# Patient Record
Sex: Male | Born: 1965 | Race: Black or African American | Hispanic: No | Marital: Married | Smoking: Never smoker
Health system: Southern US, Community
[De-identification: ages and names within clinical notes are randomized; demographics above are authoritative.]

## PROBLEM LIST (undated history)

## (undated) DIAGNOSIS — R55 Syncope and collapse: Secondary | ICD-10-CM

## (undated) DIAGNOSIS — T7840XA Allergy, unspecified, initial encounter: Secondary | ICD-10-CM

## (undated) HISTORY — DX: Syncope and collapse: R55

## (undated) HISTORY — DX: Allergy, unspecified, initial encounter: T78.40XA

---

## 1997-12-21 ENCOUNTER — Ambulatory Visit (HOSPITAL_COMMUNITY): Admission: RE | Admit: 1997-12-21 | Discharge: 1997-12-21 | Payer: Self-pay | Admitting: Urology

## 1998-04-04 ENCOUNTER — Ambulatory Visit (HOSPITAL_COMMUNITY): Admission: RE | Admit: 1998-04-04 | Discharge: 1998-04-04 | Payer: Self-pay | Admitting: Urology

## 2008-09-21 ENCOUNTER — Ambulatory Visit: Payer: Self-pay | Admitting: Family Medicine

## 2008-09-21 DIAGNOSIS — J309 Allergic rhinitis, unspecified: Secondary | ICD-10-CM | POA: Insufficient documentation

## 2008-09-21 DIAGNOSIS — H6120 Impacted cerumen, unspecified ear: Secondary | ICD-10-CM

## 2008-09-30 ENCOUNTER — Encounter: Payer: Self-pay | Admitting: Family Medicine

## 2008-11-01 ENCOUNTER — Ambulatory Visit: Payer: Self-pay | Admitting: Family Medicine

## 2008-11-01 LAB — CONVERTED CEMR LAB
Bilirubin Urine: NEGATIVE
Glucose, Urine, Semiquant: NEGATIVE
Specific Gravity, Urine: 1.025
WBC Urine, dipstick: NEGATIVE
pH: 5.5

## 2008-11-08 ENCOUNTER — Ambulatory Visit: Payer: Self-pay | Admitting: Family Medicine

## 2008-11-08 DIAGNOSIS — R740 Nonspecific elevation of levels of transaminase and lactic acid dehydrogenase [LDH]: Secondary | ICD-10-CM

## 2008-11-10 LAB — CONVERTED CEMR LAB
AST: 54 units/L — ABNORMAL HIGH (ref 0–37)
Albumin: 3.9 g/dL (ref 3.5–5.2)
Basophils Absolute: 0 10*3/uL (ref 0.0–0.1)
CO2: 29 meq/L (ref 19–32)
Chloride: 104 meq/L (ref 96–112)
Ferritin: 639.5 ng/mL — ABNORMAL HIGH (ref 22.0–322.0)
GFR calc non Af Amer: 119.36 mL/min (ref >60)
Glucose, Bld: 89 mg/dL (ref 70–99)
HCT: 48.6 % (ref 39.0–52.0)
Hemoglobin: 16.9 g/dL (ref 13.0–17.0)
Hep B S Ab: NEGATIVE
Lymphs Abs: 2.1 10*3/uL (ref 0.7–4.0)
MCHC: 34.7 g/dL (ref 30.0–36.0)
MCV: 93.6 fL (ref 78.0–100.0)
Monocytes Relative: 10 % (ref 3.0–12.0)
Neutro Abs: 2.1 10*3/uL (ref 1.4–7.7)
PSA: 0.42 ng/mL (ref 0.10–4.00)
Potassium: 4 meq/L (ref 3.5–5.1)
RDW: 12.2 % (ref 11.5–14.6)
Sodium: 135 meq/L (ref 135–145)
TSH: 0.91 microintl units/mL (ref 0.35–5.50)

## 2008-12-21 ENCOUNTER — Ambulatory Visit: Payer: Self-pay | Admitting: Family Medicine

## 2008-12-21 DIAGNOSIS — S6390XA Sprain of unspecified part of unspecified wrist and hand, initial encounter: Secondary | ICD-10-CM | POA: Insufficient documentation

## 2009-12-28 ENCOUNTER — Ambulatory Visit: Payer: Self-pay | Admitting: Family Medicine

## 2009-12-28 DIAGNOSIS — R319 Hematuria, unspecified: Secondary | ICD-10-CM

## 2009-12-28 LAB — CONVERTED CEMR LAB
Glucose, Urine, Semiquant: NEGATIVE
Nitrite: NEGATIVE
Protein, U semiquant: 30
Urobilinogen, UA: 1

## 2009-12-29 ENCOUNTER — Encounter: Payer: Self-pay | Admitting: Family Medicine

## 2010-01-18 ENCOUNTER — Ambulatory Visit: Payer: Self-pay | Admitting: Family Medicine

## 2010-01-20 LAB — CONVERTED CEMR LAB
Glucose, Urine, Semiquant: NEGATIVE
Specific Gravity, Urine: 1.02
WBC Urine, dipstick: NEGATIVE
pH: 7

## 2010-01-25 ENCOUNTER — Ambulatory Visit: Payer: Self-pay | Admitting: Family Medicine

## 2010-01-25 DIAGNOSIS — N390 Urinary tract infection, site not specified: Secondary | ICD-10-CM

## 2010-06-20 NOTE — Assessment & Plan Note (Signed)
Summary: 1 month rov/njr   Vital Signs:  Patient profile:   45 year old male Weight:      256 pounds O2 Sat:      95 % Temp:     98.2 degrees F Pulse rate:   69 / minute BP sitting:   130 / 80  (left arm)  Vitals Entered By: Pura Spice, RN (January 25, 2010 4:06 PM) CC: 1 mth follow up states had urine culture last week and stated "urine clear now"   History of Present Illness: Here to follow up on a UTI. he was seen here on 12-28-09 for urinary urgency and blood in the urine. His culture grew pan-sensitive E. coli, and he took a 10 day course of Levaquin. He now feels fine with no symptoms, and his repeat UA on 01-18-10 was clear.   Allergies: 1)  ! Erythromycin  Past History:  Past Medical History: Reviewed history from 09/21/2008 and no changes required. Chickenpox Allergic rhinitis Syncope  Past Surgical History: Reviewed history from 09/21/2008 and no changes required. Denies surgical history  Review of Systems  The patient denies anorexia, fever, weight loss, weight gain, vision loss, decreased hearing, hoarseness, chest pain, syncope, dyspnea on exertion, peripheral edema, prolonged cough, headaches, hemoptysis, abdominal pain, melena, hematochezia, severe indigestion/heartburn, hematuria, incontinence, genital sores, muscle weakness, suspicious skin lesions, transient blindness, difficulty walking, depression, unusual weight change, abnormal bleeding, enlarged lymph nodes, angioedema, breast masses, and testicular masses.    Physical Exam  General:  Well-developed,well-nourished,in no acute distress; alert,appropriate and cooperative throughout examination Abdomen:  Bowel sounds positive,abdomen soft and non-tender without masses, organomegaly or hernias noted.   Impression & Recommendations:  Problem # 1:  UTI (ICD-599.0)  The following medications were removed from the medication list:    Levaquin 500 Mg Tabs (Levofloxacin) .Marland Kitchen... 1 tab by mouth q  day  Patient Instructions: 1)  He seems to be fully recovered.  2)  Please schedule a follow-up appointment as needed .

## 2010-06-20 NOTE — Assessment & Plan Note (Signed)
Summary: hematuria/dm   Vital Signs:  Patient profile:   45 year old male Height:      78 inches (198.12 cm) Weight:      254 pounds (115.45 kg) BMI:     29.46 O2 Sat:      97 % on Room air Temp:     98.3 degrees F (36.83 degrees C) oral Pulse rate:   70 / minute BP sitting:   120 / 88  (left arm) Cuff size:   large  Vitals Entered By: Josph Macho RMA (December 28, 2009 3:40 PM)  O2 Flow:  Room air CC: Passing of blood in urine-feels a little bit of pressure while urinating X1 day/ CF Is Patient Diabetic? No   History of Present Illness: Patient in today for evaluation of hematuria. he has been noting some mild urinary hesitancy for a while but once his flow has started it is normal strength and not painful. Then in past 24 hours he has noted some suprapubic pressure and had an episode of frank hematuria this am. He has not seen blood again but still has the abdominal discomfort. He had an episode of chills earlier today but no fevers. He denies back or flank pain. No history of n/v/diarrhea/anorexia/new sexual partners/penile lesions or discharge. he does not normally struggle with constipation but he does note he had to strain this am to move his bowels but after he finally did he noted his abdominal discomfort did improve somewhat.  Current Medications (verified): 1)  None  Allergies (verified): 1)  ! Erythromycin  Past History:  Past medical history reviewed for relevance to current acute and chronic problems. Social history (including risk factors) reviewed for relevance to current acute and chronic problems.  Past Medical History: Reviewed history from 09/21/2008 and no changes required. Chickenpox Allergic rhinitis Syncope  Social History: Reviewed history from 09/21/2008 and no changes required. Married Never Smoked Alcohol use-no Drug use-no Regular exercise-no  Review of Systems      See HPI  Physical Exam  General:  Well-developed,well-nourished,in  no acute distress; alert,appropriate and cooperative throughout examination Mouth:  Oral mucosa and oropharynx without lesions or exudates.  Teeth in good repair. Neck:  No deformities, masses, or tenderness noted. Lungs:  Normal respiratory effort, chest expands symmetrically. Lungs are clear to auscultation, no crackles or wheezes. Heart:  Normal rate and regular rhythm. S1 and S2 normal without gallop, murmur, click, rub or other extra sounds. Abdomen:  Bowel sounds positive,abdomen soft and non-tender without masses, organomegaly or hernias noted. Msk:  No deformity or scoliosis noted of thoracic or lumbar spine.   Extremities:  No clubbing, cyanosis, edema, or deformity noted  Psych:  Cognition and judgment appear intact. Alert and cooperative with normal attention span and concentration. No apparent delusions, illusions, hallucinations   Impression & Recommendations:  Problem # 1:  HEMATURIA UNSPECIFIED (ICD-599.70)  His updated medication list for this problem includes:    Levaquin 500 Mg Tabs (Levofloxacin) .Marland Kitchen... 1 tab by mouth q day  Orders: T-Urine Culture ([facility]rum Order) [id number]) First episode, start Levaquin return in 1 mn for repeat UA and further evaluation. maintain adequate hydration and report any concerning symptoms  Complete Medication List: 1)  Levaquin 500 Mg Tabs (Levofloxacin) .Marland Kitchen.. 1 tab by mouth q day  Patient Instructions: 1)  Please schedule a follow-up appointment in 1 month with Dr Abran Cantor. 2)  Urine- dip prior to visit ICD-9 : 599.71 3)  Drink plenty of fluids up to 3-4 quarts  a day. Cranberry juice is especially recommended in addition to large amounts of water. Avoid caffeine & carbonated drinks, they tend to irritate the bladder, Return in 3-5 days if you're not better: sooner if you're feeling worse.  4)  Take your antibiotic as prescribed until ALL of it is gone, but stop if you develop a rash or swelling and contact our office as soon as possible.    Prescriptions: LEVAQUIN 500 MG TABS (LEVOFLOXACIN) 1 tab by mouth q day  #10 x 0   Entered and Authorized by:   Danise Edge MD   Signed by:   Danise Edge MD on 12/28/2009   Method used:   Electronically to        [facility] [id number] (retail)       [address]       [place], [place]  [address]       Ph: [phone] or [phone]       Fax: [phone]   RxID:   [id number]   Laboratory Results   Urine Tests    Routine Urinalysis   Color: amber Appearance: Clear Glucose: negative   (Normal Range: Negative) Bilirubin: small   (Normal Range: Negative) Ketone: negative   (Normal Range: Negative) Spec. Gravity: 1.020   (Normal Range: 1.003-1.035) Blood: large   (Normal Range: Negative) pH: 6.0   (Normal Range: 5.0-8.0) Protein: 30   (Normal Range: Negative) Urobilinogen: 1.0   (Normal Range: 0-1) Nitrite: negative   (Normal Range: Negative) Leukocyte Esterace: moderate   (Normal Range: Negative)

## 2011-08-01 ENCOUNTER — Encounter: Payer: Self-pay | Admitting: Family Medicine

## 2011-08-02 ENCOUNTER — Ambulatory Visit (INDEPENDENT_AMBULATORY_CARE_PROVIDER_SITE_OTHER): Payer: BC Managed Care – PPO | Admitting: Family Medicine

## 2011-08-02 ENCOUNTER — Encounter: Payer: Self-pay | Admitting: Family Medicine

## 2011-08-02 ENCOUNTER — Ambulatory Visit (INDEPENDENT_AMBULATORY_CARE_PROVIDER_SITE_OTHER)
Admission: RE | Admit: 2011-08-02 | Discharge: 2011-08-02 | Disposition: A | Discharge status: Home / Self Care | Payer: BC Managed Care – PPO | Source: Ambulatory Visit | Attending: Family Medicine | Admitting: Family Medicine

## 2011-08-02 VITALS — BP 126/84 | HR 56 | Temp 98.2°F | Wt 254.0 lb

## 2011-08-02 DIAGNOSIS — M546 Pain in thoracic spine: Secondary | ICD-10-CM

## 2011-08-02 DIAGNOSIS — M542 Cervicalgia: Secondary | ICD-10-CM

## 2011-08-02 MED ORDER — ETODOLAC 500 MG PO TABS: 500.0000 mg | ORAL_TABLET | Freq: Two times a day (BID) | ORAL | Status: DC

## 2011-08-02 MED ORDER — CYCLOBENZAPRINE HCL 10 MG PO TABS: 10.0000 mg | ORAL_TABLET | Freq: Three times a day (TID) | ORAL | Status: AC | PRN

## 2011-08-02 NOTE — Progress Notes (Signed)
  Subjective:    Patient ID: Jordan Torres, male    DOB: 10/01/1965, 46 y.o.   MRN: [id number]  HPI Here for intermittent pain and spasm in the neck and upper back that started about a year ago. This is centered on the left side and radiates toward the left shoulder. At times he feels numbness and tinglng down the left arm. No pain or weakness in the arm. Heat and Advil help somewhat.no hx of trauma.    Review of Systems  Constitutional: Negative.   HENT: Positive for neck pain and neck stiffness.   Musculoskeletal: Positive for back pain.       Objective:   Physical Exam  Constitutional: He appears well-developed and well-nourished.  Musculoskeletal:       Mildly tender to the left side of the lower cervical area with full ROM.           Assessment & Plan:  Switch to Etodolac bid and add Flexeril. Get Xrays today

## 2011-08-06 NOTE — Progress Notes (Signed)
Quick Note:    Left message  ______

## 2011-09-21 ENCOUNTER — Telehealth: Payer: Self-pay | Admitting: *Deleted

## 2011-09-21 NOTE — Telephone Encounter (Signed)
Have him see me OV again to reassess. We may want to get an MRI scan of his spine to see if this is a surgical issue or not

## 2011-09-21 NOTE — Telephone Encounter (Signed)
Pt is asking if we can make a referral to a neurologist or neurosurgeon for his neck pain with numbness in arm.???

## 2011-09-24 NOTE — Telephone Encounter (Signed)
Called and lft pt a vm stating to pls call and sch ov in order for Dr Clent Ridges to reassess pts arm and neck pain. Waiting on call back.

## 2011-09-24 NOTE — Telephone Encounter (Signed)
I tried to reach pt by phone, no answer. Can you try to call again and offer a office visit?

## 2012-01-22 ENCOUNTER — Other Ambulatory Visit (INDEPENDENT_AMBULATORY_CARE_PROVIDER_SITE_OTHER): Payer: BC Managed Care – PPO

## 2012-01-22 DIAGNOSIS — Z0000 Encounter for general adult medical examination without abnormal findings: Secondary | ICD-10-CM

## 2012-01-22 LAB — BASIC METABOLIC PANEL
BUN: 11 mg/dL (ref 6–23)
CO2: 28 mEq/L (ref 19–32)
Chloride: 98 mEq/L (ref 96–112)
Creatinine, Ser: 0.9 mg/dL (ref 0.4–1.5)
Glucose, Bld: 140 mg/dL — ABNORMAL HIGH (ref 70–99)

## 2012-01-22 LAB — CBC WITH DIFFERENTIAL/PLATELET
Basophils Relative: 0.3 % (ref 0.0–3.0)
Eosinophils Absolute: 0.2 10*3/uL (ref 0.0–0.7)
MCHC: 33.2 g/dL (ref 30.0–36.0)
MCV: 94.3 fl (ref 78.0–100.0)
Monocytes Absolute: 0.5 10*3/uL (ref 0.1–1.0)
Neutrophils Relative %: 53 % (ref 43.0–77.0)
Platelets: 187 10*3/uL (ref 150.0–400.0)
RBC: 5.48 Mil/uL (ref 4.22–5.81)
RDW: 12.7 % (ref 11.5–14.6)

## 2012-01-22 LAB — POCT URINALYSIS DIPSTICK
Bilirubin, UA: NEGATIVE
Leukocytes, UA: NEGATIVE
Nitrite, UA: NEGATIVE
Protein, UA: NEGATIVE
pH, UA: 6

## 2012-01-22 LAB — PSA: PSA: 0.56 ng/mL (ref 0.10–4.00)

## 2012-01-22 LAB — HEPATIC FUNCTION PANEL
Bilirubin, Direct: 0.2 mg/dL (ref 0.0–0.3)
Total Protein: 7.6 g/dL (ref 6.0–8.3)

## 2012-01-22 LAB — LIPID PANEL: Cholesterol: 195 mg/dL (ref 0–200)

## 2012-01-24 NOTE — Progress Notes (Signed)
Quick Note:  I spoke with pt ______ 

## 2012-01-30 ENCOUNTER — Encounter: Payer: Self-pay | Admitting: Family Medicine

## 2012-01-30 ENCOUNTER — Ambulatory Visit (INDEPENDENT_AMBULATORY_CARE_PROVIDER_SITE_OTHER): Payer: BC Managed Care – PPO | Admitting: Family Medicine

## 2012-01-30 VITALS — BP 126/88 | HR 73 | Temp 98.0°F | Ht 78.0 in | Wt 254.0 lb

## 2012-01-30 DIAGNOSIS — E119 Type 2 diabetes mellitus without complications: Secondary | ICD-10-CM | POA: Insufficient documentation

## 2012-01-30 DIAGNOSIS — Z0000 Encounter for general adult medical examination without abnormal findings: Secondary | ICD-10-CM

## 2012-01-30 NOTE — Progress Notes (Signed)
  Subjective:    Patient ID: Jordan Torres, male    DOB: 11-24-1965, 46 y.o.   MRN: [id number]  HPI 46 yr old male for a cpx. He feels fine and has no concerns. He had been dealing with some neck pain and stiffness but he saw a chiropractor a few months ago, and these treatments were very successful.    Review of Systems  Constitutional: Negative.   HENT: Negative.   Eyes: Negative.   Respiratory: Negative.   Cardiovascular: Negative.   Gastrointestinal: Negative.   Genitourinary: Negative.   Musculoskeletal: Negative.   Skin: Negative.   Neurological: Negative.   Hematological: Negative.   Psychiatric/Behavioral: Negative.        Objective:   Physical Exam  Constitutional: He is oriented to person, place, and time. He appears well-developed and well-nourished. No distress.  HENT:  Head: Normocephalic and atraumatic.  Right Ear: External ear normal.  Left Ear: External ear normal.  Nose: Nose normal.  Mouth/Throat: Oropharynx is clear and moist. No oropharyngeal exudate.  Eyes: Conjunctivae normal and EOM are normal. Pupils are equal, round, and reactive to light. Right eye exhibits no discharge. Left eye exhibits no discharge. No scleral icterus.  Neck: Neck supple. No JVD present. No tracheal deviation present. No thyromegaly present.  Cardiovascular: Normal rate, regular rhythm, normal heart sounds and intact distal pulses.  Exam reveals no gallop and no friction rub.   No murmur heard. Pulmonary/Chest: Effort normal and breath sounds normal. No respiratory distress. He has no wheezes. He has no rales. He exhibits no tenderness.  Abdominal: Soft. Bowel sounds are normal. He exhibits no distension and no mass. There is no tenderness. There is no rebound and no guarding.  Genitourinary: Rectum normal, prostate normal and penis normal. Guaiac negative stool. No penile tenderness.  Musculoskeletal: Normal range of motion. He exhibits no edema and no tenderness.    Lymphadenopathy:    He has no cervical adenopathy.  Neurological: He is alert and oriented to person, place, and time. He has normal reflexes. No cranial nerve deficit. He exhibits normal muscle tone. Coordination normal.  Skin: Skin is warm and dry. No rash noted. He is not diaphoretic. No erythema. No pallor.  Psychiatric: He has a normal mood and affect. His behavior is normal. Judgment and thought content normal.          Assessment & Plan:  Well exam. Since his fasting glucose was 140, he now has type 2 diabetes.  We discussed this at length, and I will refer him to Nutrition for some educational meetings. He will exercise and watch the diet. Get a baseline A1c today. We will try this with no medications for 90 days, then recheck with an OV and another A1c.

## 2012-05-05 ENCOUNTER — Ambulatory Visit: Payer: BC Managed Care – PPO | Admitting: Family Medicine

## 2012-05-07 ENCOUNTER — Encounter: Payer: Self-pay | Admitting: Family Medicine

## 2012-05-07 ENCOUNTER — Ambulatory Visit (INDEPENDENT_AMBULATORY_CARE_PROVIDER_SITE_OTHER): Payer: BC Managed Care – PPO | Admitting: Family Medicine

## 2012-05-07 VITALS — BP 122/86 | HR 65 | Temp 97.9°F | Wt 244.0 lb

## 2012-05-07 DIAGNOSIS — E119 Type 2 diabetes mellitus without complications: Secondary | ICD-10-CM

## 2012-05-07 LAB — HEMOGLOBIN A1C: Hgb A1c MFr Bld: 5.9 % (ref 4.6–6.5)

## 2012-05-07 NOTE — Progress Notes (Signed)
  Subjective:    Patient ID: Jordan Torres, male    DOB: [birth year], 46 y.o.   MRN: [id number]  HPI Here for a 3 month follow up of newly diagnosed diabetes. His A1c was 6.8. He has greatly reduced his carb intake and has lost about 10 lbs. He feels great.   Review of Systems  Constitutional: Negative.   Respiratory: Negative.   Cardiovascular: Negative.        Objective:   Physical Exam  Constitutional: He appears well-developed and well-nourished.  Cardiovascular: Normal rate, regular rhythm, normal heart sounds and intact distal pulses.   Pulmonary/Chest: Effort normal and breath sounds normal.          Assessment & Plan:  Get another A1c today

## 2013-04-10 IMAGING — CR DG CERVICAL SPINE COMPLETE 4+V
6 series · 6 of 6 positions shown · non-contrast
Comparison: None.

CLINICAL DATA: left arm and finger numbness, left-sided neck pain

CERVICAL SPINE - COMPLETE 4+ VIEW

[view not recorded (1 of 6)]
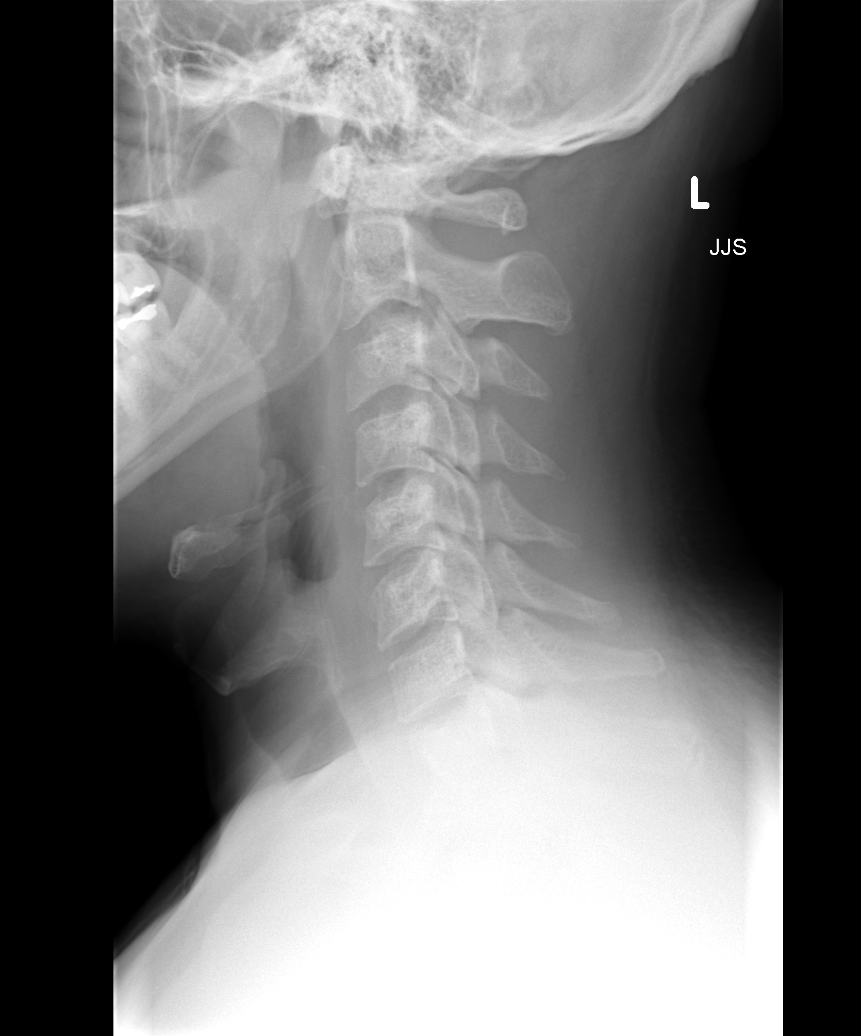

[view not recorded (2 of 6)]
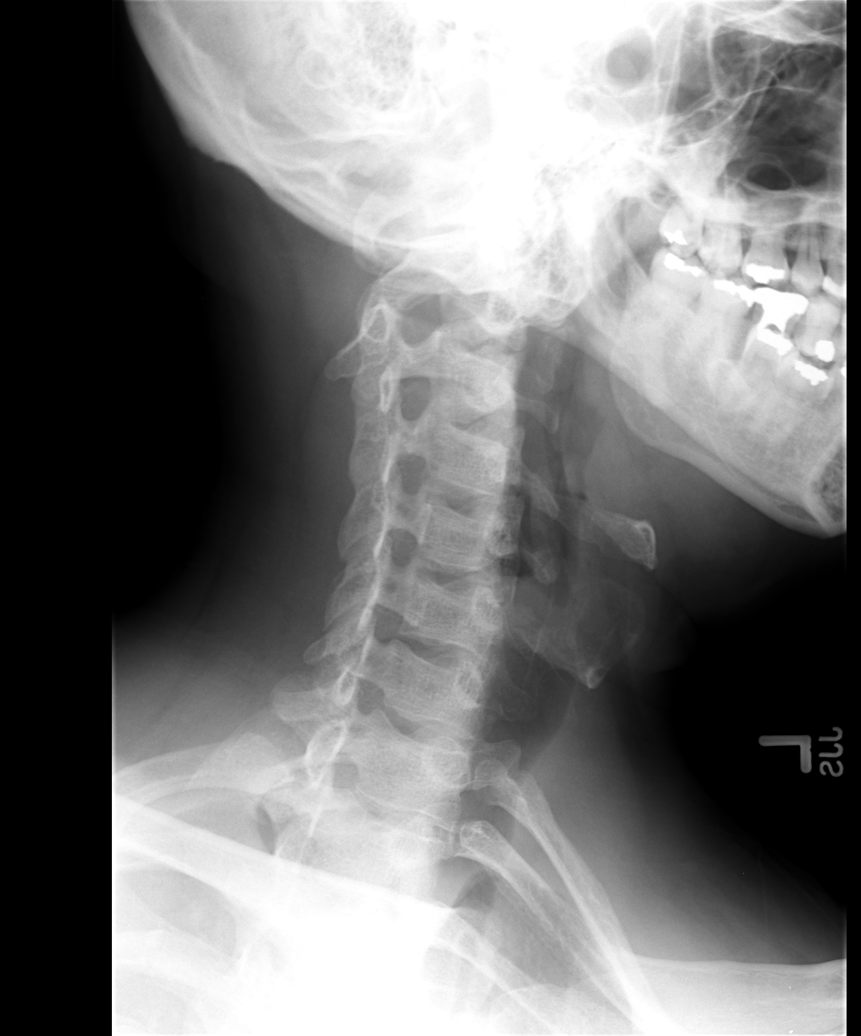

[view not recorded (3 of 6)]
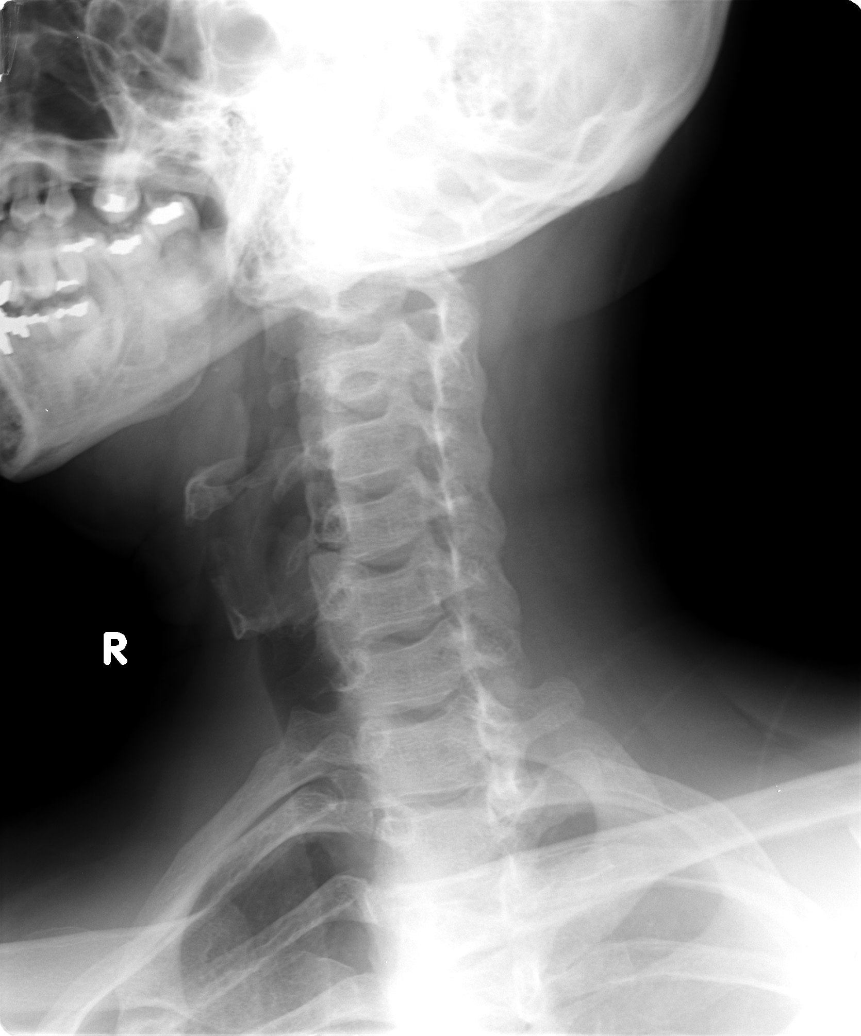

[view not recorded (4 of 6)]
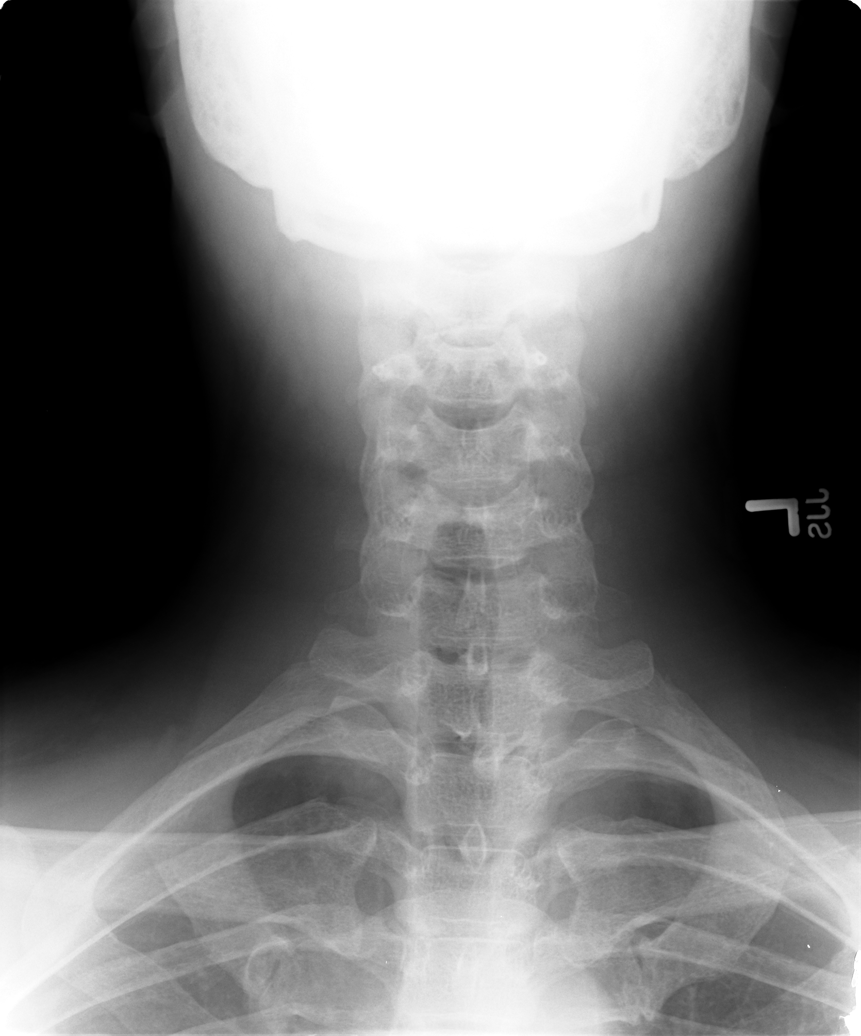

[view not recorded (5 of 6)]
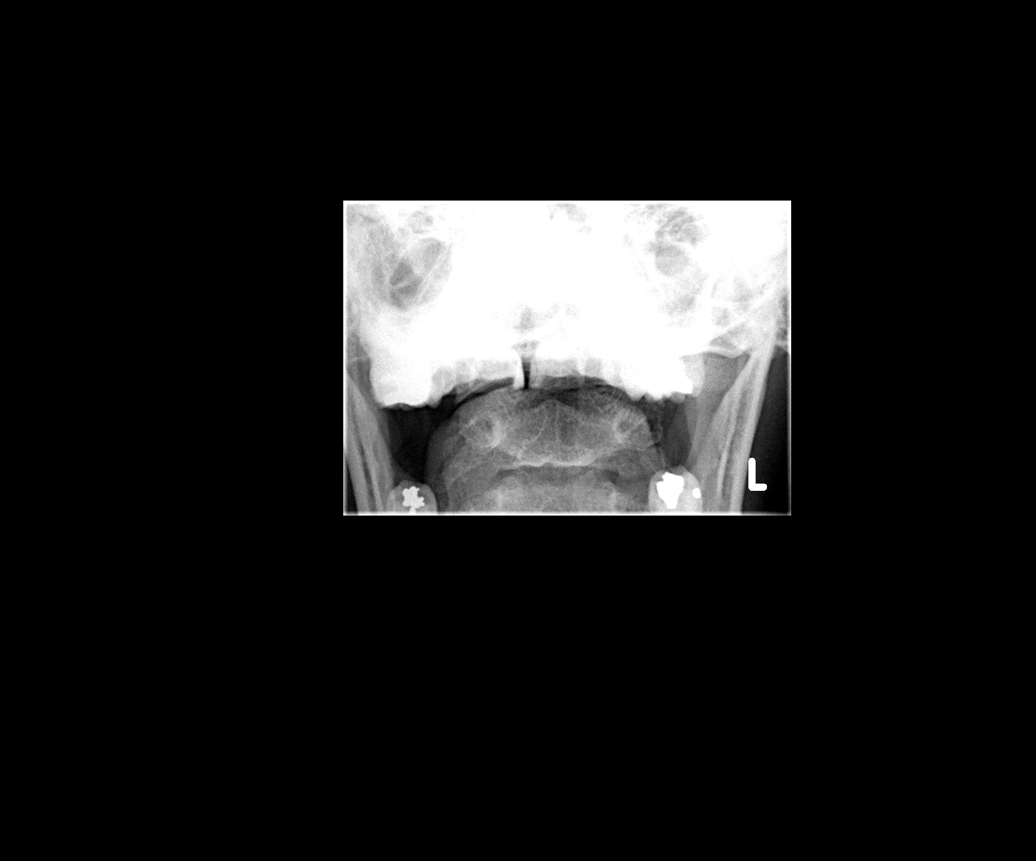

[view not recorded (6 of 6)]
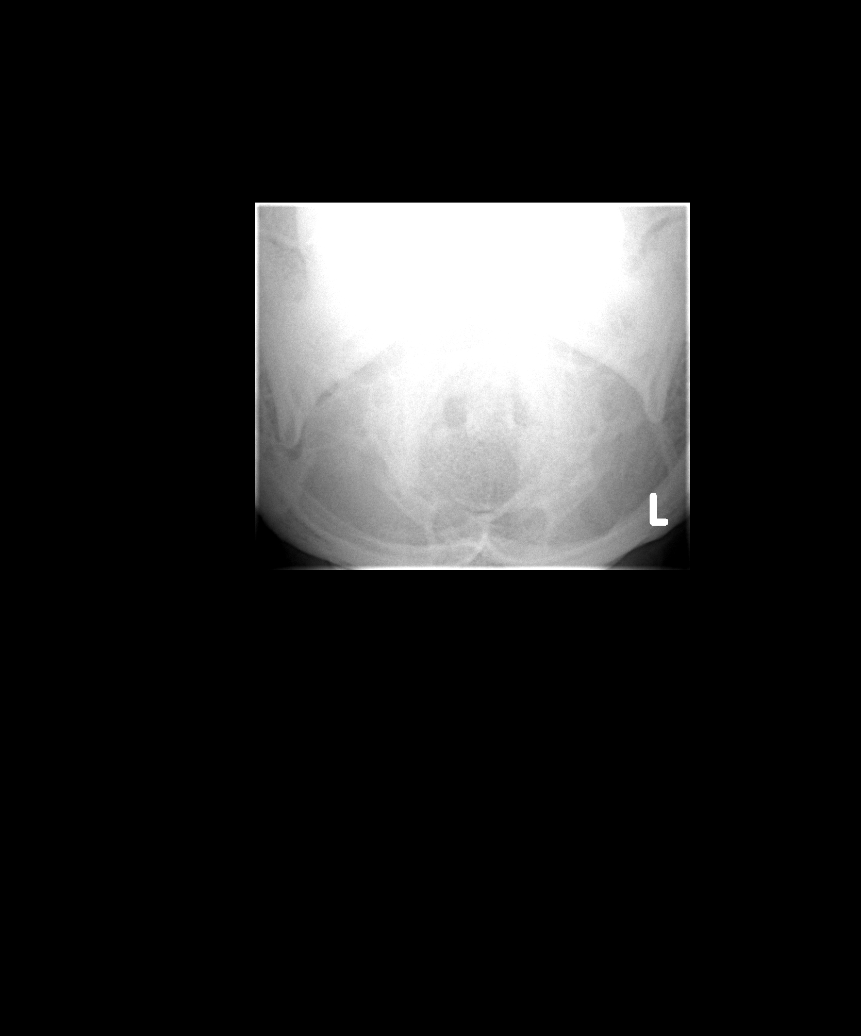

[6 of 6 positions shown; findings below may reference images not displayed]

FINDINGS: Normal alignment.  Preserved vertebral body heights and
disc spaces.  No fracture evident or malalignment.  Facets aligned.
Visualized foramina patent.  Intact odontoid.  Normal prevertebral
soft tissues.
IMPRESSION: No acute finding by plain radiography.  No significant degenerative
changes or spondylosis.

## 2014-06-09 ENCOUNTER — Other Ambulatory Visit (INDEPENDENT_AMBULATORY_CARE_PROVIDER_SITE_OTHER): Payer: BLUE CROSS/BLUE SHIELD

## 2014-06-09 DIAGNOSIS — Z0000 Encounter for general adult medical examination without abnormal findings: Secondary | ICD-10-CM

## 2014-06-09 LAB — BASIC METABOLIC PANEL
BUN: 12 mg/dL (ref 6–23)
CHLORIDE: 98 meq/L (ref 96–112)
CO2: 32 meq/L (ref 19–32)
CREATININE: 0.96 mg/dL (ref 0.40–1.50)
Calcium: 9.9 mg/dL (ref 8.4–10.5)
GFR: 107.98 mL/min (ref >60.00)
Glucose, Bld: 107 mg/dL — ABNORMAL HIGH (ref 70–99)
Potassium: 4.6 mEq/L (ref 3.5–5.1)
Sodium: 135 mEq/L (ref 135–145)

## 2014-06-09 LAB — LIPID PANEL
CHOL/HDL RATIO: 3
CHOLESTEROL: 128 mg/dL (ref 0–200)
HDL: 39.4 mg/dL (ref >39.00)
LDL CALC: 68 mg/dL (ref 0–99)
NonHDL: 88.6
Triglycerides: 103 mg/dL (ref 0.0–149.0)
VLDL: 20.6 mg/dL (ref 0.0–40.0)

## 2014-06-09 LAB — COMPREHENSIVE METABOLIC PANEL
ALBUMIN: 4.4 g/dL (ref 3.5–5.2)
ALT: 51 U/L (ref 0–53)
AST: 37 U/L (ref 0–37)
Alkaline Phosphatase: 71 U/L (ref 39–117)
BUN: 12 mg/dL (ref 6–23)
CALCIUM: 9.9 mg/dL (ref 8.4–10.5)
CO2: 32 meq/L (ref 19–32)
CREATININE: 0.96 mg/dL (ref 0.40–1.50)
Chloride: 98 mEq/L (ref 96–112)
GFR: 107.98 mL/min (ref >60.00)
Glucose, Bld: 107 mg/dL — ABNORMAL HIGH (ref 70–99)
Potassium: 4.6 mEq/L (ref 3.5–5.1)
Sodium: 135 mEq/L (ref 135–145)
Total Bilirubin: 1.7 mg/dL — ABNORMAL HIGH (ref 0.2–1.2)
Total Protein: 7.6 g/dL (ref 6.0–8.3)

## 2014-06-09 LAB — CBC WITH DIFFERENTIAL/PLATELET
BASOS PCT: 0.4 % (ref 0.0–3.0)
Basophils Absolute: 0 10*3/uL (ref 0.0–0.1)
EOS PCT: 2.3 % (ref 0.0–5.0)
Eosinophils Absolute: 0.2 10*3/uL (ref 0.0–0.7)
HCT: 50.7 % (ref 39.0–52.0)
HEMOGLOBIN: 17.3 g/dL — AB (ref 13.0–17.0)
Lymphocytes Relative: 35 % (ref 12.0–46.0)
Lymphs Abs: 2.3 10*3/uL (ref 0.7–4.0)
MCHC: 34.1 g/dL (ref 30.0–36.0)
MCV: 91.6 fl (ref 78.0–100.0)
Monocytes Absolute: 0.6 10*3/uL (ref 0.1–1.0)
Monocytes Relative: 9 % (ref 3.0–12.0)
NEUTROS PCT: 53.3 % (ref 43.0–77.0)
Neutro Abs: 3.5 10*3/uL (ref 1.4–7.7)
Platelets: 220 10*3/uL (ref 150.0–400.0)
RBC: 5.53 Mil/uL (ref 4.22–5.81)
RDW: 12.7 % (ref 11.5–15.5)
WBC: 6.6 10*3/uL (ref 4.0–10.5)

## 2014-06-09 LAB — POCT URINALYSIS DIPSTICK
Bilirubin, UA: NEGATIVE
GLUCOSE UA: NEGATIVE
KETONES UA: NEGATIVE
Leukocytes, UA: NEGATIVE
Nitrite, UA: NEGATIVE
Protein, UA: NEGATIVE
SPEC GRAV UA: 1.01
UROBILINOGEN UA: 0.2
pH, UA: 5.5

## 2014-06-09 LAB — TSH: TSH: 0.98 u[IU]/mL (ref 0.35–4.50)

## 2014-06-09 LAB — PSA: PSA: 0.31 ng/mL (ref 0.10–4.00)

## 2014-06-15 ENCOUNTER — Encounter: Payer: Self-pay | Admitting: Family Medicine

## 2014-06-15 ENCOUNTER — Ambulatory Visit (INDEPENDENT_AMBULATORY_CARE_PROVIDER_SITE_OTHER): Payer: BLUE CROSS/BLUE SHIELD | Admitting: Family Medicine

## 2014-06-15 VITALS — BP 144/86 | HR 60 | Temp 98.0°F | Ht 78.0 in | Wt 240.0 lb

## 2014-06-15 DIAGNOSIS — Z0000 Encounter for general adult medical examination without abnormal findings: Secondary | ICD-10-CM

## 2014-06-15 NOTE — Progress Notes (Signed)
   Subjective:    Patient ID: Jordan Torres, male    DOB: 09-Oct-1965, 49 y.o.   MRN: [id number]  HPI 49 yr old male for a cpx. He feels well.    Review of Systems  Constitutional: Negative.   HENT: Negative.   Eyes: Negative.   Respiratory: Negative.   Cardiovascular: Negative.   Gastrointestinal: Negative.   Genitourinary: Negative.   Musculoskeletal: Negative.   Skin: Negative.   Neurological: Negative.   Psychiatric/Behavioral: Negative.        Objective:   Physical Exam  Constitutional: He is oriented to person, place, and time. He appears well-developed and well-nourished. No distress.  HENT:  Head: Normocephalic and atraumatic.  Right Ear: External ear normal.  Left Ear: External ear normal.  Nose: Nose normal.  Mouth/Throat: Oropharynx is clear and moist. No oropharyngeal exudate.  Eyes: Conjunctivae and EOM are normal. Pupils are equal, round, and reactive to light. Right eye exhibits no discharge. Left eye exhibits no discharge. No scleral icterus.  Neck: Neck supple. No JVD present. No tracheal deviation present. No thyromegaly present.  Cardiovascular: Normal rate, regular rhythm, normal heart sounds and intact distal pulses.  Exam reveals no gallop and no friction rub.   No murmur heard. Pulmonary/Chest: Effort normal and breath sounds normal. No respiratory distress. He has no wheezes. He has no rales. He exhibits no tenderness.  Abdominal: Soft. Bowel sounds are normal. He exhibits no distension and no mass. There is no tenderness. There is no rebound and no guarding.  Genitourinary: Rectum normal, prostate normal and penis normal. Guaiac negative stool. No penile tenderness.  Musculoskeletal: Normal range of motion. He exhibits no edema or tenderness.  Lymphadenopathy:    He has no cervical adenopathy.  Neurological: He is alert and oriented to person, place, and time. He has normal reflexes. No cranial nerve deficit. He exhibits normal muscle tone.  Coordination normal.  Skin: Skin is warm and dry. No rash noted. He is not diaphoretic. No erythema. No pallor.  Psychiatric: He has a normal mood and affect. His behavior is normal. Judgment and thought content normal.          Assessment & Plan:  Well exam.

## 2014-06-15 NOTE — Progress Notes (Signed)
Pre visit review using our clinic review tool, if applicable. No additional management support is needed unless otherwise documented below in the visit note. 

## 2015-08-02 ENCOUNTER — Telehealth: Payer: Self-pay | Admitting: Family Medicine

## 2015-08-02 NOTE — Telephone Encounter (Signed)
Ok to schedule.

## 2015-08-02 NOTE — Telephone Encounter (Signed)
Pt took off work on 3-16 and per pt was given an appt for cpx on that day. Dr Sarajane Jews I am sorry pt does not have an appt on 08-04-15. Can I create 30 min slot?

## 2015-08-02 NOTE — Telephone Encounter (Signed)
Pt has been sch

## 2015-08-04 ENCOUNTER — Ambulatory Visit (INDEPENDENT_AMBULATORY_CARE_PROVIDER_SITE_OTHER): Payer: BC Managed Care – PPO | Admitting: Family Medicine

## 2015-08-04 ENCOUNTER — Encounter: Payer: Self-pay | Admitting: Family Medicine

## 2015-08-04 ENCOUNTER — Telehealth: Payer: Self-pay | Admitting: *Deleted

## 2015-08-04 VITALS — BP 133/85 | HR 55 | Temp 98.2°F | Ht 78.0 in | Wt 215.0 lb

## 2015-08-04 DIAGNOSIS — Z0000 Encounter for general adult medical examination without abnormal findings: Secondary | ICD-10-CM | POA: Diagnosis not present

## 2015-08-04 DIAGNOSIS — E119 Type 2 diabetes mellitus without complications: Secondary | ICD-10-CM

## 2015-08-04 LAB — CBC WITH DIFFERENTIAL/PLATELET
BASOS PCT: 0.5 % (ref 0.0–3.0)
Basophils Absolute: 0 10*3/uL (ref 0.0–0.1)
EOS PCT: 1.7 % (ref 0.0–5.0)
Eosinophils Absolute: 0.1 10*3/uL (ref 0.0–0.7)
HCT: 54.4 % — ABNORMAL HIGH (ref 39.0–52.0)
LYMPHS PCT: 39.4 % (ref 12.0–46.0)
Lymphs Abs: 2.3 10*3/uL (ref 0.7–4.0)
MCHC: 33.1 g/dL (ref 30.0–36.0)
MCV: 90.7 fl (ref 78.0–100.0)
Monocytes Absolute: 0.3 10*3/uL (ref 0.1–1.0)
Monocytes Relative: 6.1 % (ref 3.0–12.0)
Neutro Abs: 3 10*3/uL (ref 1.4–7.7)
Neutrophils Relative %: 52.3 % (ref 43.0–77.0)
Platelets: 186 10*3/uL (ref 150.0–400.0)
RBC: 5.99 Mil/uL — ABNORMAL HIGH (ref 4.22–5.81)
RDW: 12.6 % (ref 11.5–15.5)
WBC: 5.7 10*3/uL (ref 4.0–10.5)

## 2015-08-04 LAB — POC URINALSYSI DIPSTICK (AUTOMATED)
Bilirubin, UA: NEGATIVE
Blood, UA: NEGATIVE
Leukocytes, UA: NEGATIVE
Nitrite, UA: NEGATIVE
PROTEIN UA: NEGATIVE
SPEC GRAV UA: 1.02
Urobilinogen, UA: 0.2
pH, UA: 6

## 2015-08-04 LAB — BASIC METABOLIC PANEL
BUN: 11 mg/dL (ref 6–23)
CHLORIDE: 98 meq/L (ref 96–112)
CO2: 31 meq/L (ref 19–32)
Calcium: 9.8 mg/dL (ref 8.4–10.5)
Creatinine, Ser: 0.87 mg/dL (ref 0.40–1.50)
GFR: 120.37 mL/min (ref >60.00)
GLUCOSE: 305 mg/dL — AB (ref 70–99)
Potassium: 4.6 mEq/L (ref 3.5–5.1)
SODIUM: 136 meq/L (ref 135–145)

## 2015-08-04 LAB — PSA: PSA: 0.58 ng/mL (ref 0.10–4.00)

## 2015-08-04 LAB — HEPATIC FUNCTION PANEL
ALT: 27 U/L (ref 0–53)
AST: 26 U/L (ref 0–37)
Albumin: 4.3 g/dL (ref 3.5–5.2)
Alkaline Phosphatase: 86 U/L (ref 39–117)
BILIRUBIN DIRECT: 0.3 mg/dL (ref 0.0–0.3)
Total Bilirubin: 1.6 mg/dL — ABNORMAL HIGH (ref 0.2–1.2)
Total Protein: 7.6 g/dL (ref 6.0–8.3)

## 2015-08-04 LAB — TSH: TSH: 0.32 u[IU]/mL — AB (ref 0.35–4.50)

## 2015-08-04 LAB — LIPID PANEL
Cholesterol: 175 mg/dL (ref 0–200)
HDL: 53 mg/dL (ref >39.00)
LDL Cholesterol: 92 mg/dL (ref 0–99)
NONHDL: 122.28
Total CHOL/HDL Ratio: 3
Triglycerides: 151 mg/dL — ABNORMAL HIGH (ref 0.0–149.0)
VLDL: 30.2 mg/dL (ref 0.0–40.0)

## 2015-08-04 LAB — HEMOGLOBIN A1C: Hgb A1c MFr Bld: 13.5 % — ABNORMAL HIGH (ref 4.6–6.5)

## 2015-08-04 MED ORDER — CYCLOBENZAPRINE HCL 10 MG PO TABS: 10.0000 mg | ORAL_TABLET | Freq: Three times a day (TID) | ORAL | Status: DC | PRN

## 2015-08-04 NOTE — Progress Notes (Signed)
Pre visit review using our clinic review tool, if applicable. No additional management support is needed unless otherwise documented below in the visit note. 

## 2015-08-04 NOTE — Telephone Encounter (Signed)
CRITICAL VALUE STICKER  CRITICAL VALUE:hgb 18  RECEIVER (INITALS):rv  DATE & TIME NOTIFIED:4:45 pm 08/04/15  MD NOTIFIED:yes  TIME OF NOTIFICATION:4:50 pm  RESPONSE: noted

## 2015-08-08 NOTE — Progress Notes (Signed)
   Subjective:    Patient ID: Jordan Torres, male    DOB: 09-18-1965, 50 y.o.   MRN: [id number]  HPI 50 yr old male for a cpx. He feels well. Of note his labs showed an A1c of 13.5 so he has started taking Metformin 1000 mg bid.    Review of Systems  Constitutional: Negative.   HENT: Negative.   Eyes: Negative.   Respiratory: Negative.   Cardiovascular: Negative.   Gastrointestinal: Negative.   Genitourinary: Negative.   Musculoskeletal: Negative.   Skin: Negative.   Neurological: Negative.   Psychiatric/Behavioral: Negative.        Objective:   Physical Exam  Constitutional: He is oriented to person, place, and time. He appears well-developed and well-nourished. No distress.  HENT:  Head: Normocephalic and atraumatic.  Right Ear: External ear normal.  Left Ear: External ear normal.  Nose: Nose normal.  Mouth/Throat: Oropharynx is clear and moist. No oropharyngeal exudate.  Eyes: Conjunctivae and EOM are normal. Pupils are equal, round, and reactive to light. Right eye exhibits no discharge. Left eye exhibits no discharge. No scleral icterus.  Neck: Neck supple. No JVD present. No tracheal deviation present. No thyromegaly present.  Cardiovascular: Normal rate, regular rhythm, normal heart sounds and intact distal pulses.  Exam reveals no gallop and no friction rub.   No murmur heard. Pulmonary/Chest: Effort normal and breath sounds normal. No respiratory distress. He has no wheezes. He has no rales. He exhibits no tenderness.  Abdominal: Soft. Bowel sounds are normal. He exhibits no distension and no mass. There is no tenderness. There is no rebound and no guarding.  Genitourinary: Rectum normal, prostate normal and penis normal. Guaiac negative stool. No penile tenderness.  Musculoskeletal: Normal range of motion. He exhibits no edema or tenderness.  Lymphadenopathy:    He has no cervical adenopathy.  Neurological: He is alert and oriented to person, place, and time. He  has normal reflexes. No cranial nerve deficit. He exhibits normal muscle tone. Coordination normal.  Skin: Skin is warm and dry. No rash noted. He is not diaphoretic. No erythema. No pallor.  Psychiatric: He has a normal mood and affect. His behavior is normal. Judgment and thought content normal.          Assessment & Plan:  Well exam. We discussed diet and exercise. He will begin monitoring his own glucoses at home. Repeat A1c in 90 days

## 2015-08-09 MED ORDER — METFORMIN HCL 1000 MG PO TABS: 1000.0000 mg | ORAL_TABLET | Freq: Two times a day (BID) | ORAL | Status: DC

## 2015-08-09 MED FILL — metFORMIN HCL 1000 MG TABS: 1000 | 30 days supply | Qty: 60 | Fill #0

## 2015-08-09 NOTE — Addendum Note (Signed)
Addended by: Ailene Rud E on: 08/09/2015 11:55 AM   Modules accepted: Orders

## 2015-08-15 ENCOUNTER — Telehealth: Payer: Self-pay | Admitting: Family Medicine

## 2015-08-15 NOTE — Telephone Encounter (Signed)
Teel him to cut the Metformin in half and take 1/2 tab bid (500 mg)

## 2015-08-15 NOTE — Telephone Encounter (Signed)
Pt said the following med  metFORMIN (GLUCOPHAGE) 1000 MG tablet  is making him dizzy,tingling in legs and feet,chills. Would like a call back    [phone]

## 2015-08-15 NOTE — Telephone Encounter (Signed)
I spoke with pt and went over below information and updated medication list.

## 2015-10-10 ENCOUNTER — Telehealth: Payer: Self-pay | Admitting: Family Medicine

## 2015-10-10 NOTE — Telephone Encounter (Signed)
He should get an A1c every 3 months.

## 2015-10-10 NOTE — Telephone Encounter (Signed)
Pt would like to know how do he go about getting a glucometer so that he can check his glucose at home.

## 2015-10-10 NOTE — Telephone Encounter (Signed)
I spoke with pt and he is going to check with pharmacy to find out which machine is covered with his insurance.

## 2015-10-10 NOTE — Telephone Encounter (Signed)
Pt want's to know when should he repeat A1c and does he need another office visit? Looks like there is a future lab order in computer for A1c.

## 2015-10-11 NOTE — Telephone Encounter (Signed)
Pt will be due on 11/04/2015 for lab draw. I spoke with pt and gave this information.

## 2015-11-02 ENCOUNTER — Other Ambulatory Visit (INDEPENDENT_AMBULATORY_CARE_PROVIDER_SITE_OTHER): Payer: BC Managed Care – PPO

## 2015-11-02 ENCOUNTER — Other Ambulatory Visit: Payer: BC Managed Care – PPO

## 2015-11-02 DIAGNOSIS — E119 Type 2 diabetes mellitus without complications: Secondary | ICD-10-CM | POA: Diagnosis not present

## 2015-11-02 LAB — HEMOGLOBIN A1C: HEMOGLOBIN A1C: 8.4 % — AB (ref 4.6–6.5)

## 2015-11-04 MED ORDER — GLIPIZIDE 10 MG PO TABS: 10.0000 mg | ORAL_TABLET | Freq: Two times a day (BID) | ORAL | Status: DC

## 2015-11-28 MED FILL — glipiZIDE 10 MG TABS: 10 | 30 days supply | Qty: 60 | Fill #0

## 2015-12-27 ENCOUNTER — Other Ambulatory Visit: Payer: Self-pay | Admitting: Family Medicine

## 2015-12-27 NOTE — Telephone Encounter (Signed)
Refill request for One Touch Verio testing strips and send a 90 day supply to [facility] mail order.

## 2015-12-28 MED ORDER — GLUCOSE BLOOD VI STRP: ORAL_STRIP | 1 refills | Status: DC

## 2015-12-28 NOTE — Telephone Encounter (Signed)
Per Dr. Sarajane Jews okay to order and I did send script e-scribe to [facility] mail order.

## 2016-01-26 ENCOUNTER — Telehealth: Payer: Self-pay | Admitting: Family Medicine

## 2016-01-26 ENCOUNTER — Encounter: Payer: Self-pay | Admitting: Family Medicine

## 2016-01-26 NOTE — Telephone Encounter (Signed)
[facility] - Client  [facility]     Patient Name: Jordan Torres Client [facility] - Client    Client Site [place] - Day    Physician Alysia Penna - MD    Contact Type Call    Who Is Calling Patient / Member / Family / Caregiver    Call Type Triage / Clinical  Gender: Male Relationship To Patient Self  DOB: [birth year]  Return Phone Number [phone] (Primary)  Age: 50 Y 50 M 17 D Chief Complaint Medication Question (non symptomatic)  Return Phone Number: [phone] (Primary) Reason for Call Symptomatic / Request for Health Information  Address:  Initial Comment Caller is diabetic and has medication questions.   City/State/Zip: [place]  Translation No    Nurse Assessment  Nurse: Amalia Hailey, RN, Melissa Date/Time (Eastern Time): 01/26/2016 3:26:18 PM  Confirm and document reason for call. If symptomatic, describe symptoms. You must click the next button to save text entered. ---Caller is diabetic and has medication questions.  Has the patient traveled out of the country within the last 30 days? ---Not Applicable  Does the patient have any new or worsening symptoms? ---Yes  Will a triage be completed? ---Yes  Related visit to physician within the last 2 weeks? ---Yes  Does the PT have any chronic conditions? (i.e. diabetes, asthma, etc.) ---Yes  List chronic conditions. ---Glypizide, Diabetic  Is this a behavioral health or substance abuse call? ---No    Guidelines      Guideline Title Affirmed Question Affirmed Notes Nurse Date/Time (Eastern Time)        Disp. Time Eilene Ghazi Time) Disposition Final User                         Comments  User: Colin Ina, RN Date/Time (Eastern Time): 01/26/2016 3:47:43 PM  Caller reports that since March he has had elevated A1C of 8.4, prior to that it was elevated to 13 from 6.8, has lost wt down to t 215 , pt is 6'4",  has changed his medication from Metformin to Glypizide 10mg  bid, has noticed in the last week his FBS in the AM are 160's -170's. has not changed eating, eating salads with vinegrette dressings, Cheerios for breakfast, has snack of PB and cracker mid morning, has been eating cinnamon and dinners have been cutting pasta out, decrease beef, eating chicken, eating low glycemic fruits such as apples. The goal for his fasting sugars is 105-138. Caller advised to call the office in the morning to see when he has a schedule office visit f/u ( caller thinks he has appt. this month) last office visit was in June and suppose to be q 3 months. Caller informed will make a note of this information in the chart and what his concerns are about his sugars running higher in the morning , concerned about the Glypezide if dosage needs to be increased, or changed.

## 2016-01-27 MED ORDER — METFORMIN HCL 1000 MG PO TABS: 1000.0000 mg | ORAL_TABLET | Freq: Two times a day (BID) | ORAL | 1 refills | Status: DC

## 2016-01-27 MED FILL — metFORMIN HCL 1000 MG TABS: 1000 | 90 days supply | Qty: 180 | Fill #0

## 2016-01-27 NOTE — Telephone Encounter (Signed)
Per Dr. Sarajane Jews, pt should continue with Metformin 1000 mg bid due to recent lab results. I spoke with pt and sent new script e-scribe to [facility].

## 2016-02-13 MED FILL — glipiZIDE 10 MG TABS: 10 | 30 days supply | Qty: 60 | Fill #1

## 2016-03-28 MED FILL — glipiZIDE 10 MG TABS: 10 | 30 days supply | Qty: 60 | Fill #2

## 2016-04-20 ENCOUNTER — Ambulatory Visit (INDEPENDENT_AMBULATORY_CARE_PROVIDER_SITE_OTHER): Payer: BC Managed Care – PPO | Admitting: Family Medicine

## 2016-04-20 ENCOUNTER — Encounter: Payer: Self-pay | Admitting: Family Medicine

## 2016-04-20 VITALS — BP 144/92 | HR 70 | Temp 97.4°F | Ht 78.0 in | Wt 242.6 lb

## 2016-04-20 DIAGNOSIS — E119 Type 2 diabetes mellitus without complications: Secondary | ICD-10-CM

## 2016-04-20 DIAGNOSIS — J209 Acute bronchitis, unspecified: Secondary | ICD-10-CM | POA: Diagnosis not present

## 2016-04-20 LAB — HEMOGLOBIN A1C: HEMOGLOBIN A1C: 5.7 % (ref 4.6–6.5)

## 2016-04-20 MED ORDER — GLUCOSE BLOOD VI STRP: ORAL_STRIP | 3 refills | Status: DC

## 2016-04-20 MED ORDER — AZITHROMYCIN 250 MG PO TABS: ORAL_TABLET | ORAL | 0 refills | Status: DC

## 2016-04-20 MED FILL — AZITHROMYCIN 250 MG TABLET: 250 | 5 days supply | Qty: 6 | Fill #0

## 2016-04-20 MED FILL — ONETOUCH VERIO TEST STRIP: 90 days supply | Qty: 100 | Fill #0

## 2016-04-20 NOTE — Progress Notes (Signed)
Pre visit review using our clinic review tool, if applicable. No additional management support is needed unless otherwise documented below in the visit note. 

## 2016-04-20 NOTE — Progress Notes (Signed)
   Subjective:    Patient ID: Jordan Torres, male    DOB: 03/21/1966, 50 y.o.   MRN: [id number]  HPI Here for a cough he has had for over a month. He has sinus congestion and PND. The cough produces yellow sputum. His am fasting glucoses have been in the range of 100 to 120.    Review of Systems  Constitutional: Negative.   HENT: Positive for congestion, postnasal drip, sinus pain and sinus pressure. Negative for sore throat.   Eyes: Negative.   Respiratory: Positive for cough. Negative for shortness of breath and wheezing.   Cardiovascular: Negative.   Neurological: Negative.        Objective:   Physical Exam  Constitutional: He is oriented to person, place, and time. He appears well-developed and well-nourished.  HENT:  Head: Atraumatic.  Right Ear: External ear normal.  Left Ear: External ear normal.  Nose: Nose normal.  Mouth/Throat: Oropharynx is clear and moist.  Eyes: Conjunctivae are normal.  Neck: No thyromegaly present.  Cardiovascular: Normal rate, regular rhythm, normal heart sounds and intact distal pulses.   Pulmonary/Chest: Effort normal and breath sounds normal.  Lymphadenopathy:    He has no cervical adenopathy.  Neurological: He is alert and oriented to person, place, and time.          Assessment & Plan:  Treat the bronchitis with a Zpack. Check an A1c today.  Laurey Morale, MD

## 2016-05-07 MED FILL — glipiZIDE 10 MG TABS: 10 | 30 days supply | Qty: 60 | Fill #3

## 2016-05-07 MED FILL — metFORMIN HCL 1000 MG TABS: 1000 | 90 days supply | Qty: 180 | Fill #1

## 2016-07-09 MED FILL — glipiZIDE 10 MG TABS: 10 | 30 days supply | Qty: 60 | Fill #4

## 2016-09-10 MED FILL — glipiZIDE 10 MG TABS: 10 | 30 days supply | Qty: 60 | Fill #5

## 2016-09-27 ENCOUNTER — Other Ambulatory Visit: Payer: Self-pay | Admitting: Family Medicine

## 2016-09-27 MED FILL — metFORMIN HCL 1000 MG TABS: 1000 | 90 days supply | Qty: 180 | Fill #0

## 2016-11-07 ENCOUNTER — Other Ambulatory Visit: Payer: Self-pay | Admitting: Family Medicine

## 2016-11-12 MED FILL — ONETOUCH VERIO TEST STRIP: 90 days supply | Qty: 100 | Fill #1

## 2016-12-04 ENCOUNTER — Ambulatory Visit (INDEPENDENT_AMBULATORY_CARE_PROVIDER_SITE_OTHER): Payer: BC Managed Care – PPO | Admitting: Family Medicine

## 2016-12-04 ENCOUNTER — Encounter: Payer: Self-pay | Admitting: Family Medicine

## 2016-12-04 VITALS — BP 148/96 | HR 54 | Temp 98.0°F | Ht 78.0 in | Wt 239.0 lb

## 2016-12-04 DIAGNOSIS — E119 Type 2 diabetes mellitus without complications: Secondary | ICD-10-CM

## 2016-12-04 DIAGNOSIS — Z0000 Encounter for general adult medical examination without abnormal findings: Secondary | ICD-10-CM

## 2016-12-04 DIAGNOSIS — I10 Essential (primary) hypertension: Secondary | ICD-10-CM | POA: Diagnosis not present

## 2016-12-04 DIAGNOSIS — Z125 Encounter for screening for malignant neoplasm of prostate: Secondary | ICD-10-CM | POA: Diagnosis not present

## 2016-12-04 LAB — BASIC METABOLIC PANEL
BUN: 13 mg/dL (ref 6–23)
CALCIUM: 9.4 mg/dL (ref 8.4–10.5)
CO2: 29 meq/L (ref 19–32)
CREATININE: 0.86 mg/dL (ref 0.40–1.50)
Chloride: 100 mEq/L (ref 96–112)
GFR: 121.31 mL/min (ref >60.00)
Glucose, Bld: 109 mg/dL — ABNORMAL HIGH (ref 70–99)
Potassium: 4.1 mEq/L (ref 3.5–5.1)
Sodium: 137 mEq/L (ref 135–145)

## 2016-12-04 LAB — HEMOGLOBIN A1C: HEMOGLOBIN A1C: 5.7 % (ref 4.6–6.5)

## 2016-12-04 LAB — CBC WITH DIFFERENTIAL/PLATELET
BASOS ABS: 0 10*3/uL (ref 0.0–0.1)
Basophils Relative: 0.8 % (ref 0.0–3.0)
Eosinophils Absolute: 0.1 10*3/uL (ref 0.0–0.7)
Eosinophils Relative: 3.2 % (ref 0.0–5.0)
HCT: 51.2 % (ref 39.0–52.0)
HEMOGLOBIN: 17.2 g/dL — AB (ref 13.0–17.0)
LYMPHS ABS: 1.8 10*3/uL (ref 0.7–4.0)
Lymphocytes Relative: 40.3 % (ref 12.0–46.0)
MCHC: 33.5 g/dL (ref 30.0–36.0)
MCV: 92.5 fl (ref 78.0–100.0)
MONO ABS: 0.4 10*3/uL (ref 0.1–1.0)
Monocytes Relative: 8.7 % (ref 3.0–12.0)
NEUTROS PCT: 47 % (ref 43.0–77.0)
Neutro Abs: 2.1 10*3/uL (ref 1.4–7.7)
Platelets: 198 10*3/uL (ref 150.0–400.0)
RBC: 5.54 Mil/uL (ref 4.22–5.81)
RDW: 13.2 % (ref 11.5–15.5)
WBC: 4.4 10*3/uL (ref 4.0–10.5)

## 2016-12-04 LAB — LIPID PANEL
CHOLESTEROL: 140 mg/dL (ref 0–200)
HDL: 41.4 mg/dL (ref >39.00)
LDL CALC: 73 mg/dL (ref 0–99)
NONHDL: 98.89
Total CHOL/HDL Ratio: 3
Triglycerides: 131 mg/dL (ref 0.0–149.0)
VLDL: 26.2 mg/dL (ref 0.0–40.0)

## 2016-12-04 LAB — HEPATIC FUNCTION PANEL
ALT: 61 U/L — AB (ref 0–53)
AST: 46 U/L — ABNORMAL HIGH (ref 0–37)
Albumin: 4.1 g/dL (ref 3.5–5.2)
Alkaline Phosphatase: 50 U/L (ref 39–117)
BILIRUBIN DIRECT: 0.3 mg/dL (ref 0.0–0.3)
BILIRUBIN TOTAL: 1.6 mg/dL — AB (ref 0.2–1.2)
TOTAL PROTEIN: 7 g/dL (ref 6.0–8.3)

## 2016-12-04 LAB — POC URINALSYSI DIPSTICK (AUTOMATED)
Bilirubin, UA: NEGATIVE
Blood, UA: NEGATIVE
Glucose, UA: NEGATIVE
Ketones, UA: NEGATIVE
LEUKOCYTES UA: NEGATIVE
Nitrite, UA: NEGATIVE
PH UA: 6 (ref 5.0–8.0)
PROTEIN UA: NEGATIVE
SPEC GRAV UA: 1.015 (ref 1.010–1.025)
UROBILINOGEN UA: 0.2 U/dL

## 2016-12-04 LAB — MICROALBUMIN / CREATININE URINE RATIO
CREATININE, U: 80.7 mg/dL
MICROALB/CREAT RATIO: 0.9 mg/g (ref 0.0–30.0)

## 2016-12-04 LAB — PSA: PSA: 0.57 ng/mL (ref 0.10–4.00)

## 2016-12-04 LAB — TSH: TSH: 0.76 u[IU]/mL (ref 0.35–4.50)

## 2016-12-04 MED ORDER — LISINOPRIL 10 MG PO TABS: 10.0000 mg | ORAL_TABLET | Freq: Every day | ORAL | 3 refills | Status: DC

## 2016-12-04 MED ORDER — METFORMIN HCL 1000 MG PO TABS: 1000.0000 mg | ORAL_TABLET | Freq: Two times a day (BID) | ORAL | 3 refills | Status: DC

## 2016-12-04 MED ORDER — GLIPIZIDE 10 MG PO TABS: 10.0000 mg | ORAL_TABLET | Freq: Two times a day (BID) | ORAL | 3 refills | Status: DC

## 2016-12-04 MED ORDER — CYCLOBENZAPRINE HCL 10 MG PO TABS: 10.0000 mg | ORAL_TABLET | Freq: Three times a day (TID) | ORAL | 5 refills | Status: AC | PRN

## 2016-12-04 MED FILL — metFORMIN HCL 1000 MG TABS: 1000 | 90 days supply | Qty: 180 | Fill #0

## 2016-12-04 MED FILL — LISINOPRIL 10 MG TABLET: 10 | 90 days supply | Qty: 90 | Fill #0

## 2016-12-04 MED FILL — CYCLOBENZAPRINE 10 MG TABLE: 10 | 30 days supply | Qty: 90 | Fill #0

## 2016-12-04 MED FILL — glipiZIDE 10 MG TABS: 10 | 90 days supply | Qty: 180 | Fill #0

## 2016-12-04 NOTE — Patient Instructions (Signed)
WE NOW OFFER   [facility] FAST TRACK!!!  SAME DAY Appointments for ACUTE CARE  Such as: Sprains, Injuries, cuts, abrasions, rashes, muscle pain, joint pain, back pain Colds, flu, sore throats, headache, allergies, cough, fever  Ear pain, sinus and eye infections Abdominal pain, nausea, vomiting, diarrhea, upset stomach Animal/insect bites  3 Easy Ways to Schedule: Walk-In Scheduling Call in scheduling Mychart Sign-up: [email or web address]         

## 2016-12-04 NOTE — Progress Notes (Signed)
   Subjective:    Patient ID: Jordan Torres, male    DOB: Oct 17, 1965, 51 y.o.   MRN: [id number]  HPI Here for a well exam. He feels fine. His am fasting glucoses average in the 120s. His BP has been slightly high for the past few months.    Review of Systems  Constitutional: Negative.   HENT: Negative.   Eyes: Negative.   Respiratory: Negative.   Cardiovascular: Negative.   Gastrointestinal: Negative.   Genitourinary: Negative.   Musculoskeletal: Negative.   Skin: Negative.   Neurological: Negative.   Psychiatric/Behavioral: Negative.        Objective:   Physical Exam  Constitutional: He is oriented to person, place, and time. He appears well-developed and well-nourished. No distress.  HENT:  Head: Normocephalic and atraumatic.  Right Ear: External ear normal.  Left Ear: External ear normal.  Nose: Nose normal.  Mouth/Throat: Oropharynx is clear and moist. No oropharyngeal exudate.  Eyes: Pupils are equal, round, and reactive to light. Conjunctivae and EOM are normal. Right eye exhibits no discharge. Left eye exhibits no discharge. No scleral icterus.  Neck: Neck supple. No JVD present. No tracheal deviation present. No thyromegaly present.  Cardiovascular: Normal rate, regular rhythm, normal heart sounds and intact distal pulses.  Exam reveals no gallop and no friction rub.   No murmur heard. Pulmonary/Chest: Effort normal and breath sounds normal. No respiratory distress. He has no wheezes. He has no rales. He exhibits no tenderness.  Abdominal: Soft. Bowel sounds are normal. He exhibits no distension and no mass. There is no tenderness. There is no rebound and no guarding.  Genitourinary: Rectum normal, prostate normal and penis normal. Rectal exam shows guaiac negative stool. No penile tenderness.  Musculoskeletal: Normal range of motion. He exhibits no edema or tenderness.  Lymphadenopathy:    He has no cervical adenopathy.  Neurological: He is alert and oriented to  person, place, and time. He has normal reflexes. No cranial nerve deficit. He exhibits normal muscle tone. Coordination normal.  Skin: Skin is warm and dry. No rash noted. He is not diaphoretic. No erythema. No pallor.  Psychiatric: He has a normal mood and affect. His behavior is normal. Judgment and thought content normal.          Assessment & Plan:  Well exam. We discussed diet and exercise. Get fasting labs. He now has HTN and we will start him on Lisinopril 10 mg daily.  Alysia Penna, MD

## 2017-02-07 ENCOUNTER — Encounter: Payer: Self-pay | Admitting: Family Medicine

## 2017-02-21 MED FILL — glipiZIDE 10 MG TABS: 10 | 90 days supply | Qty: 180 | Fill #1

## 2017-02-21 MED FILL — ONETOUCH VERIO TEST STRIP: 90 days supply | Qty: 100 | Fill #2

## 2017-02-21 MED FILL — metFORMIN HCL 1000 MG TABS: 1000 | 90 days supply | Qty: 180 | Fill #1

## 2017-06-11 MED FILL — LISINOPRIL 10 MG TABS: 10 | 90 days supply | Qty: 90 | Fill #1

## 2017-07-30 ENCOUNTER — Other Ambulatory Visit: Payer: Self-pay | Admitting: Family Medicine

## 2017-10-07 ENCOUNTER — Ambulatory Visit: Payer: BC Managed Care – PPO | Admitting: Family Medicine

## 2017-10-07 ENCOUNTER — Encounter: Payer: Self-pay | Admitting: Family Medicine

## 2017-10-07 VITALS — BP 138/98 | HR 68 | Temp 97.8°F | Wt 241.0 lb

## 2017-10-07 DIAGNOSIS — M7552 Bursitis of left shoulder: Secondary | ICD-10-CM | POA: Diagnosis not present

## 2017-10-07 DIAGNOSIS — D170 Benign lipomatous neoplasm of skin and subcutaneous tissue of head, face and neck: Secondary | ICD-10-CM

## 2017-10-07 MED ORDER — ONETOUCH ULTRASOFT LANCETS MISC: 3 refills | Status: DC

## 2017-10-07 NOTE — Progress Notes (Signed)
   Subjective:    Patient ID: Jordan Torres, male    DOB: [birth year], 52 y.o.   MRN: [id number]  HPI Here for 2 issues. First about 2 months ago he began to have an intermittent pain in the left shoulder that would occasionally radiate into the upper arm. No neck pain. Several months ago he embarked on an exercise routine that includes a lot of weight lifting in the upper body. In the past one month the pain has become worse. Ibuprofen helps. Second he noticed a lump in the right neck that does not bother him.    Review of Systems  Constitutional: Negative.   Respiratory: Negative.   Cardiovascular: Negative.   Musculoskeletal: Positive for arthralgias.  Neurological: Negative.        Objective:   Physical Exam  Constitutional: He appears well-developed and well-nourished.  Neck: Normal range of motion. Neck supple. No thyromegaly present.  There is a firm mobile non-tender lump in the posterior right neck under the skin   Cardiovascular: Normal rate, regular rhythm, normal heart sounds and intact distal pulses.  Pulmonary/Chest: Effort normal and breath sounds normal.  Musculoskeletal:  He is tender in the anterior left shoulder over the subAC area. Full ROM   Lymphadenopathy:    He has no cervical adenopathy.          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 He is reassured this is benign and he can follow up if it begins to bother him. He also has some bursitis in the shoulder. He needs to rest this area, and I recommended he stop lifting weights for 4 weeks. Use ice packs and Ibuprofen prn.  Alysia Penna, MD

## 2017-10-08 MED FILL — ONE TOUCH VERIO TEST STRIP: 50 days supply | Qty: 50 | Fill #0

## 2017-10-08 MED FILL — ONE TOUCH ULTRASOFT LANCETS: 90 days supply | Qty: 100 | Fill #0

## 2017-10-15 MED FILL — ONE TOUCH DELICA 33G LANCET: 30 days supply | Qty: 100 | Fill #0

## 2017-12-06 ENCOUNTER — Ambulatory Visit (INDEPENDENT_AMBULATORY_CARE_PROVIDER_SITE_OTHER): Payer: BC Managed Care – PPO | Admitting: Family Medicine

## 2017-12-06 ENCOUNTER — Encounter: Payer: Self-pay | Admitting: Family Medicine

## 2017-12-06 VITALS — BP 140/90 | HR 65 | Temp 98.0°F | Ht 77.5 in | Wt 236.8 lb

## 2017-12-06 DIAGNOSIS — Z125 Encounter for screening for malignant neoplasm of prostate: Secondary | ICD-10-CM

## 2017-12-06 DIAGNOSIS — E119 Type 2 diabetes mellitus without complications: Secondary | ICD-10-CM

## 2017-12-06 DIAGNOSIS — Z0000 Encounter for general adult medical examination without abnormal findings: Secondary | ICD-10-CM

## 2017-12-06 LAB — POCT GLYCOSYLATED HEMOGLOBIN (HGB A1C): Hemoglobin A1C: 6.9 % — AB (ref 4.0–5.6)

## 2017-12-06 MED ORDER — METFORMIN HCL 1000 MG PO TABS: 1000.0000 mg | ORAL_TABLET | Freq: Two times a day (BID) | ORAL | 3 refills | Status: AC

## 2017-12-06 MED ORDER — GLIPIZIDE 5 MG PO TABS: 5.0000 mg | ORAL_TABLET | Freq: Two times a day (BID) | ORAL | 3 refills | Status: AC

## 2017-12-06 MED ORDER — LISINOPRIL 20 MG PO TABS: 20.0000 mg | ORAL_TABLET | Freq: Every day | ORAL | 3 refills | Status: AC

## 2017-12-06 NOTE — Progress Notes (Signed)
   Subjective:    Patient ID: Jordan Torres, male    DOB: 1965/09/09, 52 y.o.   MRN: [id number]  HPI Here for a well exam. He feels fine. His BP has been trending up a little for a few months, often in the 140s over 90s. His glucoses have been doing well and they sometimes drop low to the 60s.    Review of Systems  Constitutional: Negative.   HENT: Negative.   Eyes: Negative.   Respiratory: Negative.   Cardiovascular: Negative.   Gastrointestinal: Negative.   Genitourinary: Negative.   Musculoskeletal: Negative.   Skin: Negative.   Neurological: Negative.   Psychiatric/Behavioral: Negative.        Objective:   Physical Exam  Constitutional: He is oriented to person, place, and time. He appears well-developed and well-nourished. No distress.  HENT:  Head: Normocephalic and atraumatic.  Right Ear: External ear normal.  Left Ear: External ear normal.  Nose: Nose normal.  Mouth/Throat: Oropharynx is clear and moist. No oropharyngeal exudate.  Eyes: Pupils are equal, round, and reactive to light. Conjunctivae and EOM are normal. Right eye exhibits no discharge. Left eye exhibits no discharge. No scleral icterus.  Neck: Neck supple. No JVD present. No tracheal deviation present. No thyromegaly present.  Cardiovascular: Normal rate, regular rhythm, normal heart sounds and intact distal pulses. Exam reveals no gallop and no friction rub.  No murmur heard. Pulmonary/Chest: Effort normal and breath sounds normal. No respiratory distress. He has no wheezes. He has no rales. He exhibits no tenderness.  Abdominal: Soft. Bowel sounds are normal. He exhibits no distension and no mass. There is no tenderness. There is no rebound and no guarding.  Genitourinary: Rectum normal, prostate normal and penis normal. Rectal exam shows guaiac negative stool. No penile tenderness.  Musculoskeletal: Normal range of motion. He exhibits no edema or tenderness.  Lymphadenopathy:    He has no cervical  adenopathy.  Neurological: He is alert and oriented to person, place, and time. He has normal reflexes. He displays normal reflexes. No cranial nerve deficit. He exhibits normal muscle tone. Coordination normal.  Skin: Skin is warm and dry. No rash noted. He is not diaphoretic. No erythema. No pallor.  Psychiatric: He has a normal mood and affect. His behavior is normal. Judgment and thought content normal.          Assessment & Plan:  Well exam. We discussed diet and exercise. Set up fasting labs. His diabetes is well controlled and we agreed to decrease the Glipizide to 5 mg bid. His HTN is borderline and we will increase the Lisinopril to 20 mg daily. I recommended he get a colonoscopy but he wants to check with his insurance company to see what is covered first.  Alysia Penna, MD

## 2017-12-09 ENCOUNTER — Other Ambulatory Visit: Payer: BC Managed Care – PPO

## 2017-12-09 DIAGNOSIS — Z0000 Encounter for general adult medical examination without abnormal findings: Secondary | ICD-10-CM | POA: Diagnosis not present

## 2017-12-09 LAB — POC URINALSYSI DIPSTICK (AUTOMATED)
Bilirubin, UA: NEGATIVE
Glucose, UA: NEGATIVE
Ketones, UA: NEGATIVE
Leukocytes, UA: NEGATIVE
Nitrite, UA: NEGATIVE
PROTEIN UA: POSITIVE — AB
RBC UA: NEGATIVE
Urobilinogen, UA: 1 E.U./dL
pH, UA: 6 (ref 5.0–8.0)

## 2017-12-09 LAB — CBC WITH DIFFERENTIAL/PLATELET
BASOS PCT: 0.7 % (ref 0.0–3.0)
Basophils Absolute: 0 10*3/uL (ref 0.0–0.1)
EOS ABS: 0.2 10*3/uL (ref 0.0–0.7)
Eosinophils Relative: 4 % (ref 0.0–5.0)
HCT: 47.9 % (ref 39.0–52.0)
Hemoglobin: 16.1 g/dL (ref 13.0–17.0)
LYMPHS ABS: 1.9 10*3/uL (ref 0.7–4.0)
Lymphocytes Relative: 37.6 % (ref 12.0–46.0)
MCHC: 33.7 g/dL (ref 30.0–36.0)
MCV: 93.9 fl (ref 78.0–100.0)
MONO ABS: 0.5 10*3/uL (ref 0.1–1.0)
Monocytes Relative: 9 % (ref 3.0–12.0)
NEUTROS ABS: 2.5 10*3/uL (ref 1.4–7.7)
Neutrophils Relative %: 48.7 % (ref 43.0–77.0)
PLATELETS: 168 10*3/uL (ref 150.0–400.0)
RBC: 5.1 Mil/uL (ref 4.22–5.81)
RDW: 12.9 % (ref 11.5–15.5)
WBC: 5.1 10*3/uL (ref 4.0–10.5)

## 2017-12-09 LAB — HEPATIC FUNCTION PANEL
ALBUMIN: 4.1 g/dL (ref 3.5–5.2)
ALT: 45 U/L (ref 0–53)
AST: 36 U/L (ref 0–37)
Alkaline Phosphatase: 57 U/L (ref 39–117)
Bilirubin, Direct: 0.2 mg/dL (ref 0.0–0.3)
TOTAL PROTEIN: 7.4 g/dL (ref 6.0–8.3)
Total Bilirubin: 1.4 mg/dL — ABNORMAL HIGH (ref 0.2–1.2)

## 2017-12-09 LAB — BASIC METABOLIC PANEL
BUN: 16 mg/dL (ref 6–23)
CHLORIDE: 100 meq/L (ref 96–112)
CO2: 28 mEq/L (ref 19–32)
CREATININE: 0.97 mg/dL (ref 0.40–1.50)
Calcium: 9.2 mg/dL (ref 8.4–10.5)
GFR: 105.15 mL/min (ref >60.00)
Glucose, Bld: 169 mg/dL — ABNORMAL HIGH (ref 70–99)
POTASSIUM: 4.5 meq/L (ref 3.5–5.1)
Sodium: 137 mEq/L (ref 135–145)

## 2017-12-09 LAB — HEMOGLOBIN A1C: Hgb A1c MFr Bld: 6.9 % — ABNORMAL HIGH (ref 4.6–6.5)

## 2017-12-09 LAB — LIPID PANEL
CHOL/HDL RATIO: 4
Cholesterol: 152 mg/dL (ref 0–200)
HDL: 41.2 mg/dL (ref >39.00)
LDL Cholesterol: 88 mg/dL (ref 0–99)
NONHDL: 110.68
Triglycerides: 115 mg/dL (ref 0.0–149.0)
VLDL: 23 mg/dL (ref 0.0–40.0)

## 2017-12-09 LAB — PSA: PSA: 0.67 ng/mL (ref 0.10–4.00)

## 2017-12-09 LAB — TSH: TSH: 0.85 u[IU]/mL (ref 0.35–4.50)

## 2017-12-10 ENCOUNTER — Encounter: Payer: BC Managed Care – PPO | Admitting: Family Medicine

## 2017-12-11 MED FILL — LISINOPRIL 20 MG TABLET: 20 | 90 days supply | Qty: 90 | Fill #0

## 2017-12-11 MED FILL — glipiZIDE 5 MG TABS: 5 | 90 days supply | Qty: 180 | Fill #0

## 2017-12-11 MED FILL — metFORMIN HCL 1000 MG TABS: 1000 | 90 days supply | Qty: 180 | Fill #0

## 2018-04-09 ENCOUNTER — Telehealth: Payer: Self-pay | Admitting: Family Medicine

## 2018-04-09 NOTE — Telephone Encounter (Signed)
Copied from [place] [id number]. Topic: General - Other >> Apr 09, 2018  2:36 PM Jordan Torres wrote: Reason for CRM: pt lives in [place] now and last saw dr fry in July 2019. Pt needs new rxs  true metrix meter , lancets and test strips. Pt wife insurance did not cover one touch meter. Pt is checking his BS once a day. [facility] [place] [address]  phone [phone]

## 2018-04-11 NOTE — Telephone Encounter (Signed)
Dr. Sarajane Jews please advise if refills can be sent in for pt.  Last seen 11/2017.  Thanks

## 2018-04-12 NOTE — Telephone Encounter (Signed)
Please send in refills for 90 days, but he will need to see a new PCP after that

## 2018-04-14 MED ORDER — GLUCOSE BLOOD VI STRP: ORAL_STRIP | 0 refills | Status: AC

## 2018-04-14 MED ORDER — LANCETS MISC: 0 refills | Status: AC

## 2018-04-14 MED ORDER — TRUE METRIX METER W/DEVICE KIT: PACK | 0 refills | Status: AC

## 2018-04-14 NOTE — Telephone Encounter (Signed)
I have sent the refills in to the pharmacy requested.  Called and lmom to make the pt aware that he will need to set up with new PCP in [facility]
